# Patient Record
Sex: Female | Born: 1995 | Race: White | Hispanic: No | Marital: Married | Smoking: Never smoker
Health system: Southern US, Community
[De-identification: ages and names within clinical notes are randomized; demographics above are authoritative.]

## PROBLEM LIST (undated history)

## (undated) HISTORY — PX: WISDOM TOOTH EXTRACTION: SHX21

## (undated) HISTORY — PX: MOUTH SURGERY: SHX715

---
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
## 2021-08-19 ENCOUNTER — Encounter: Payer: Self-pay | Admitting: *Deleted

## 2021-08-20 ENCOUNTER — Ambulatory Visit (INDEPENDENT_AMBULATORY_CARE_PROVIDER_SITE_OTHER): Payer: 59 | Admitting: Obstetrics and Gynecology

## 2021-08-20 ENCOUNTER — Encounter: Payer: Self-pay | Admitting: Obstetrics and Gynecology

## 2021-08-20 ENCOUNTER — Other Ambulatory Visit (HOSPITAL_COMMUNITY)
Admission: RE | Admit: 2021-08-20 | Discharge: 2021-08-20 | Disposition: A | Discharge status: Home / Self Care | Payer: 59 | Source: Ambulatory Visit | Attending: Obstetrics and Gynecology | Admitting: Obstetrics and Gynecology

## 2021-08-20 VITALS — BP 104/66 | HR 71 | Wt 130.0 lb

## 2021-08-20 DIAGNOSIS — Z124 Encounter for screening for malignant neoplasm of cervix: Secondary | ICD-10-CM | POA: Diagnosis not present

## 2021-08-20 DIAGNOSIS — R102 Pelvic and perineal pain: Secondary | ICD-10-CM

## 2021-08-20 DIAGNOSIS — Z113 Encounter for screening for infections with a predominantly sexual mode of transmission: Secondary | ICD-10-CM

## 2021-08-20 DIAGNOSIS — Z01419 Encounter for gynecological examination (general) (routine) without abnormal findings: Secondary | ICD-10-CM | POA: Insufficient documentation

## 2021-08-20 DIAGNOSIS — N941 Unspecified dyspareunia: Secondary | ICD-10-CM

## 2021-08-20 NOTE — Progress Notes (Signed)
? ? ?GYNECOLOGY ANNUAL PREVENTATIVE CARE ENCOUNTER NOTE ? ?Subjective:  ? Jasmin Ruiz is a 26 y.o. G0P0000 female here for a annual gynecologic exam. Current complaints: painful intercourse. Pain with deep thrusting, used to be more sporadic and now happens every time she has intercourse. Rates 5/10, feels stabbing. Worsening over time.    ? ?Denies abnormal vaginal bleeding, discharge, pelvic pain, or other gynecologic concerns. Accepts vaginal swabs for STI screen, declines blood work. Using condoms for contraception, possibly wants to get pregnant later this year. Does have some discharge around time of ovulation but no other time. ? ?  ?Gynecologic History ?Patient's last menstrual period was 07/29/2021. ?Contraception: condoms ?Last Pap: several years. Results: normal ?Last mammogram: n/a. ?Gardisil: unsure if received ? ?Obstetric History ?OB History  ?Gravida Para Term Preterm AB Living  ?0 0 0 0 0 0  ?SAB IAB Ectopic Multiple Live Births  ?0 0 0 0 0  ? ? ?History reviewed. No pertinent past medical history. ? ?Past Surgical History:  ?Procedure Laterality Date  ? MOUTH SURGERY    ? WISDOM TOOTH EXTRACTION    ? ? ?No current outpatient medications on file prior to visit.  ? ?No current facility-administered medications on file prior to visit.  ? ? ?Allergies  ?Allergen Reactions  ? Latex Rash  ? ? ?Social History  ? ?Socioeconomic History  ? Marital status: Married  ?  Spouse name: Not on file  ? Number of children: Not on file  ? Years of education: Not on file  ? Highest education level: Not on file  ?Occupational History  ? Not on file  ?Tobacco Use  ? Smoking status: Never  ? Smokeless tobacco: Never  ?Vaping Use  ? Vaping Use: Never used  ?Substance and Sexual Activity  ? Alcohol use: Yes  ?  Comment: occ  ? Drug use: Never  ? Sexual activity: Yes  ?  Birth control/protection: Condom  ?Other Topics Concern  ? Not on file  ?Social History Narrative  ? Not on file  ? ?Social Determinants of Health   ? ?Financial Resource Strain: Not on file  ?Food Insecurity: Not on file  ?Transportation Needs: Not on file  ?Physical Activity: Not on file  ?Stress: Not on file  ?Social Connections: Not on file  ?Intimate Partner Violence: Not on file  ? ? ?Family History  ?Problem Relation Age of Onset  ? Diabetes Maternal Grandmother   ? ? ?The following portions of the patient's history were reviewed and updated as appropriate: allergies, current medications, past family history, past medical history, past social history, past surgical history and problem list. ? ?Review of Systems ?Pertinent items are noted in HPI. ?  ?Objective:  ?BP 104/66   Pulse 71   Wt 130 lb (59 kg)   LMP 07/29/2021  ?CONSTITUTIONAL: Well-developed, well-nourished female in no acute distress.  ?HENT:  Normocephalic, atraumatic, External right and left ear normal. Oropharynx is clear and moist ?EYES: Conjunctivae and EOM are normal. Pupils are equal, round, and reactive to light. No scleral icterus.  ?NECK: Normal range of motion, supple, no masses.  Normal thyroid.  ?SKIN: Skin is warm and dry. No rash noted. Not diaphoretic. No erythema. No pallor. ?NEUROLOGIC: Alert and oriented to person, place, and time. Normal reflexes, muscle tone coordination. No cranial nerve deficit noted. ?PSYCHIATRIC: Normal mood and affect. Normal behavior. Normal judgment and thought content. ?CARDIOVASCULAR: Normal heart rate noted ?RESPIRATORY: Effort normal, no problems with respiration noted. ?BREASTS: Symmetric in  size. No masses, skin changes, nipple drainage, or lymphadenopathy. ?ABDOMEN: Soft, no distention noted.  No tenderness, rebound or guarding.  ?PELVIC: Normal appearing external genitalia; normal appearing vaginal mucosa and cervix.  No abnormal discharge noted.  Pap smear obtained. Pelvic cultures obtained. Normal uterine size, no other palpable masses, no uterine or adnexal tenderness. ?MUSCULOSKELETAL: Normal range of motion. No tenderness.  No  cyanosis, clubbing, or edema.  2+ distal pulses. ? ?Exam done with chaperone present. ? ?Assessment and Plan:  ?1. Well woman exam ?Start prenatals now ?- Cytology - PAP( [facility]) ? ?2. Cervical cancer screening ?pap ? ?3. Routine screening for STI (sexually transmitted infection) ?Check STI panel ?Patient declines blood work ? ?4. Pelvic pain ?Will check yeast/BV/GC/CT/trich ?- minimal tenderness with exam ?- r/o ovarian cyst ?- US PELVIC COMPLETE WITH TRANSVAGINAL; Future ? ?5. Dyspareunia in female ?See above ? ? ?Will follow up results of pap smear/STI screen and manage accordingly. ?Encouraged improvement in diet and exercise.  ?Accepts STI screen. ?COVID vaccine  ?Mammogram n/a ?Referral for colonoscopy n/a ?Flu vaccine n/a ?Gardisil - pt to  check and see if she has had ? ?Routine preventative health maintenance measures emphasized. ?Please refer to After Visit Summary for other counseling recommendations.  ? ? ?K. Therese Sarah, MD, FACOG ?Attending ?[facility]) ? ?

## 2021-08-24 LAB — CYTOLOGY - PAP
Chlamydia: NEGATIVE
Comment: NEGATIVE
Comment: NEGATIVE
Comment: NORMAL
Diagnosis: NEGATIVE
Neisseria Gonorrhea: NEGATIVE
Trichomonas: NEGATIVE

## 2021-08-26 ENCOUNTER — Ambulatory Visit (INDEPENDENT_AMBULATORY_CARE_PROVIDER_SITE_OTHER): Payer: 59

## 2021-08-26 DIAGNOSIS — R102 Pelvic and perineal pain: Secondary | ICD-10-CM

## 2021-09-11 ENCOUNTER — Ambulatory Visit: Payer: Self-pay | Admitting: Medical-Surgical

## 2021-12-07 ENCOUNTER — Telehealth: Payer: Self-pay

## 2021-12-07 NOTE — Telephone Encounter (Signed)
Pt called to schedule NOB appt. Pt states she has mild cramping a few times a day. Pt was told this can be normal in pregnancy. Pt was told to go to [facility] if cramping worsens and/or if she begins to bleed. Pt expressed understanding.

## 2022-01-11 ENCOUNTER — Encounter: Payer: 59 | Admitting: Obstetrics & Gynecology

## 2022-01-13 ENCOUNTER — Encounter: Payer: 59 | Admitting: Obstetrics & Gynecology

## 2022-02-01 ENCOUNTER — Encounter: Payer: 59 | Admitting: Obstetrics & Gynecology

## 2023-06-19 IMAGING — US US PELVIS COMPLETE WITH TRANSVAGINAL
1 series · 13 of 25 positions shown · non-contrast
Comparison: None Available.

CLINICAL DATA: Pelvic pain, more so on the right side

EXAM:
TRANSABDOMINAL AND TRANSVAGINAL ULTRASOUND OF PELVIS
TECHNIQUE: Both transabdominal and transvaginal ultrasound examinations of the
pelvis were performed. Transabdominal technique was performed for
global imaging of the pelvis including uterus, ovaries, adnexal
regions, and pelvic cul-de-sac. It was necessary to proceed with
endovaginal exam following the transabdominal exam to visualize the
endometrium and ovaries.

[Series 1: us pelvic complete with transvaginal · 13 of 106 slices shown]
[im 1/106]
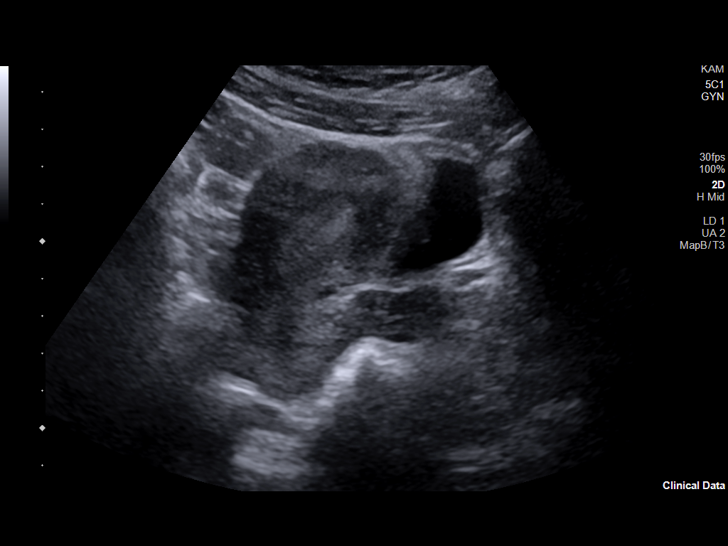
[im 9/106]
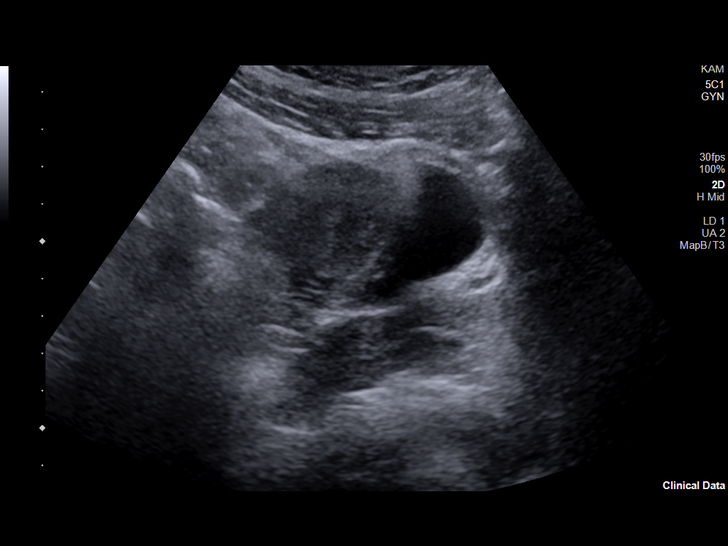
[im 18/106]
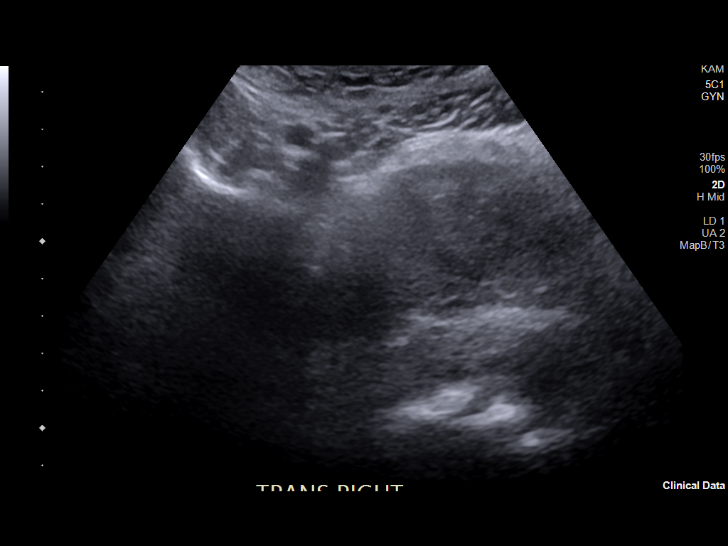
[im 27/106]
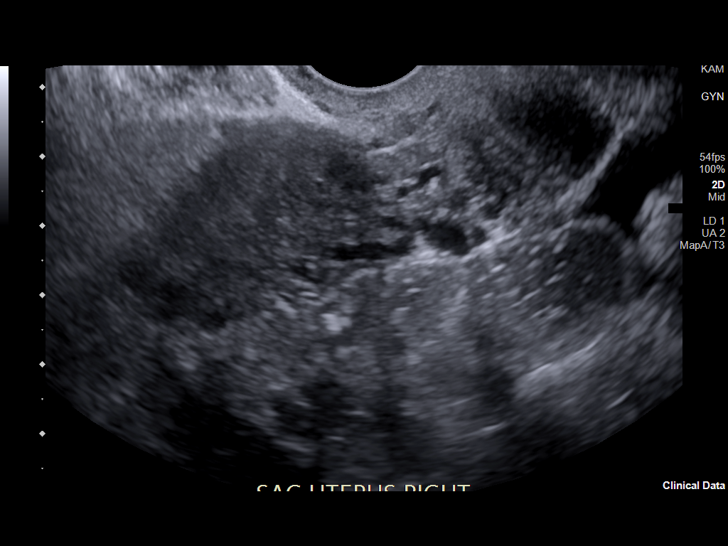
[im 36/106]
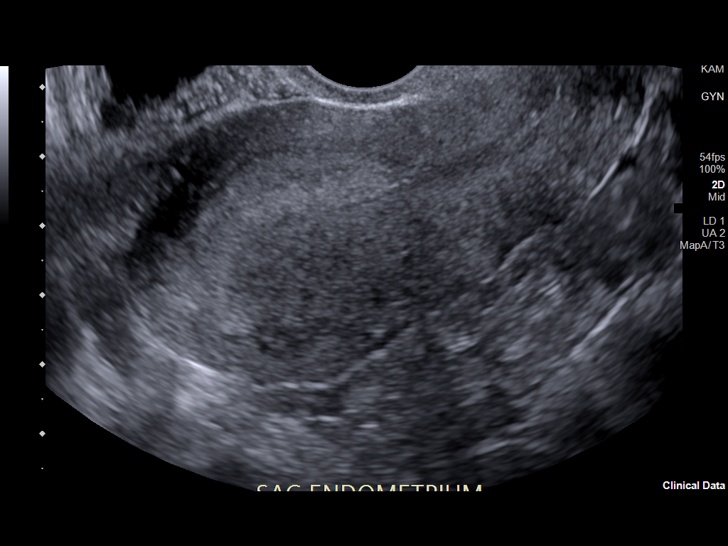
[im 44/106]
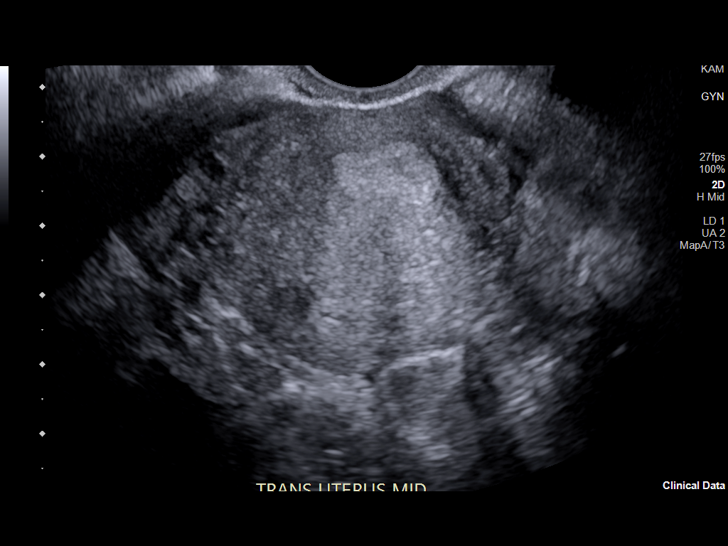
[im 53/106]
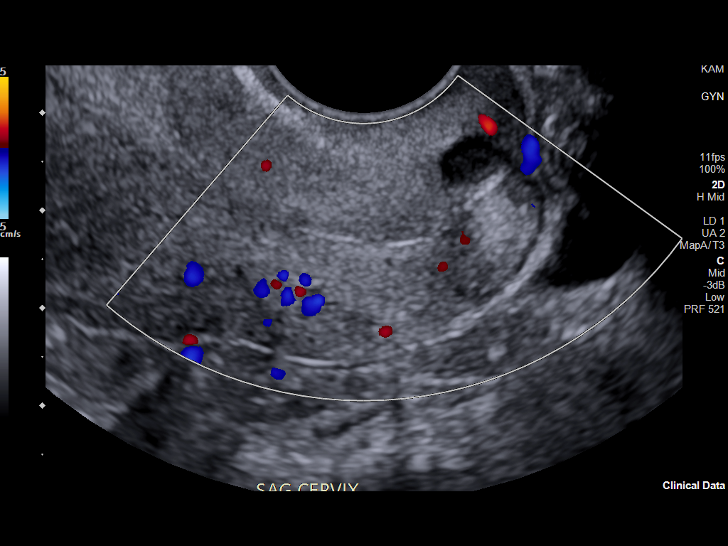
[im 62/106]
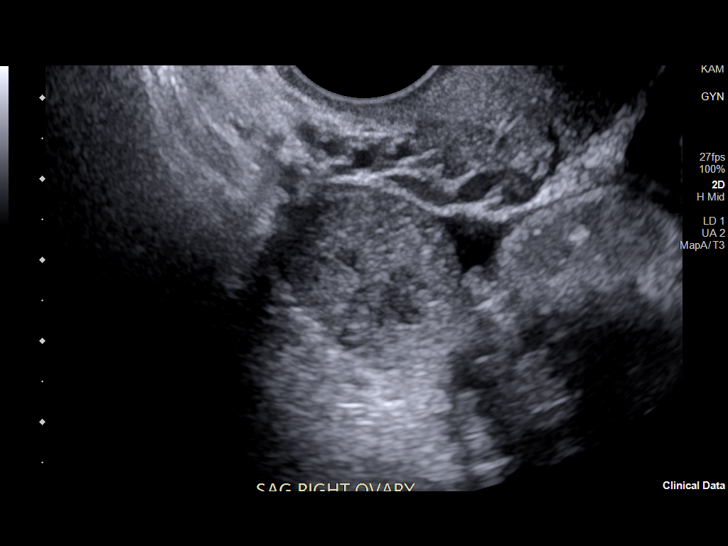
[im 71/106]
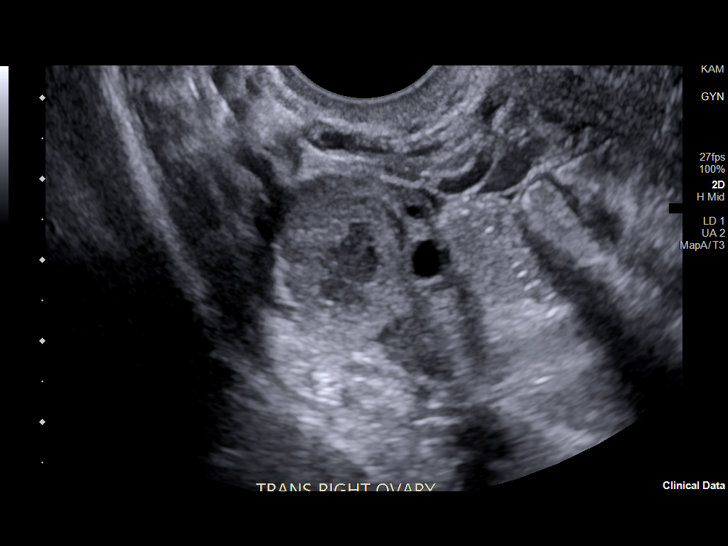
[im 79/106]
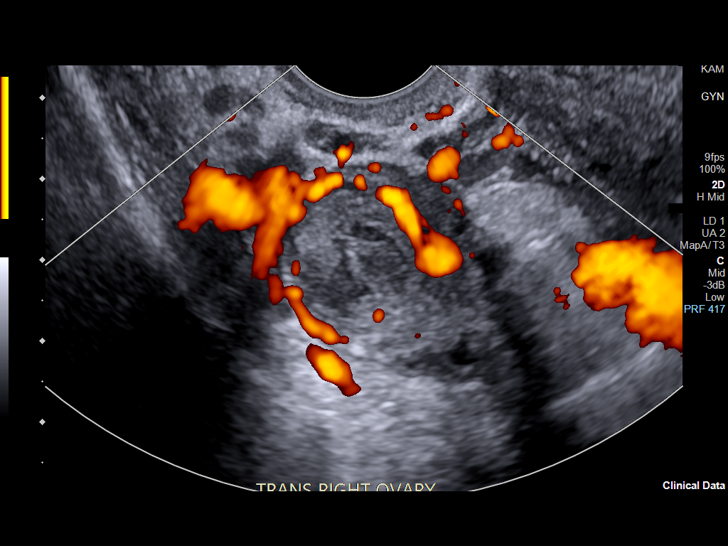
[im 88/106]
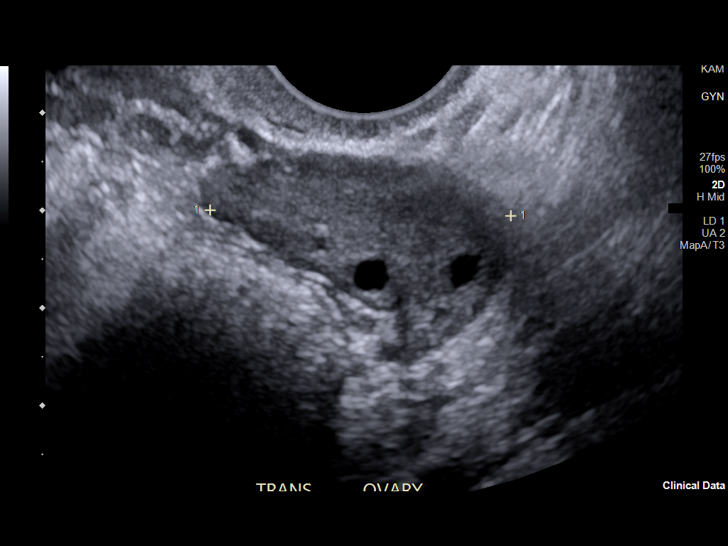
[im 97/106]
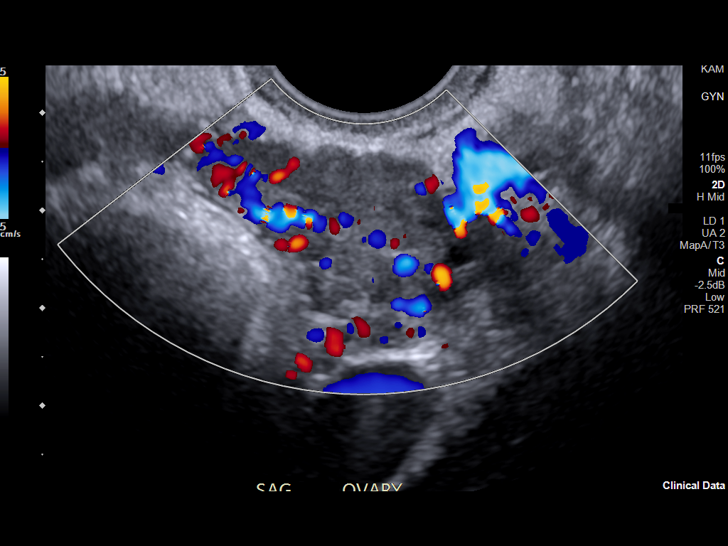
[im 106/106]
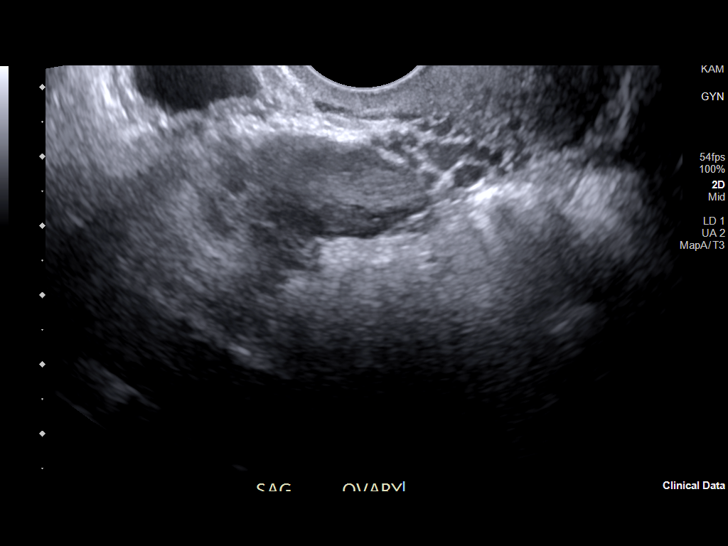

[13 of 25 positions shown; findings below may reference images not displayed]

FINDINGS: Uterus

Measurements: 8.5 x 4.2 x 5.3 cm = volume: 98 mL. No fibroids or
other mass visualized.

Endometrium

Thickness: 12 mm.  No focal abnormality visualized.

Right ovary

Measurements: 4.7 x 2.1 x 2.4 cm = volume: 12.6 mL. There is 2.2 x
1.7 cm complex cyst, possibly hemorrhagic follicle.

Left ovary

Measurements: 3.4 x 1.4 x 3.1 cm = volume: 7.7 mL. Normal
appearance/no adnexal mass.

Other findings

Trace amount of free fluid is seen.
IMPRESSION: Small amount of free fluid in the pelvis may be due to recent
rupture of ovarian cyst or follicle. There is 2.2 cm complex cyst in
the right ovary, possibly hemorrhagic follicle. Prominence of
endometrial stripe may be due to secretory phase of menstrual cycle.
